# Patient Record
Sex: Female | Born: 1940 | Race: White | Hispanic: No | Marital: Married
Health system: Midwestern US, Community
[De-identification: ages and names within clinical notes are randomized; demographics above are authoritative.]

## PROBLEM LIST (undated history)

## (undated) DIAGNOSIS — 1 ERRONEOUS ENCOUNTER ICD10: Secondary | ICD-10-CM

---

## 2013-09-07 LAB — C. DIFFICILE TOXIN MOLECULAR

## 2013-09-09 LAB — CULTURE, STOOL

## 2013-09-09 LAB — OVA AND PARASITE EXAMINATION

## 2013-09-09 LAB — GIARDIA ANTIGEN

## 2013-10-19 NOTE — Progress Notes (Signed)
Abdominal Pain  Associated symptoms include constipation, diarrhea and nausea. Pertinent negatives include no arthralgias, dysuria, fever, headaches, hematuria, myalgias or vomiting.     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   HENT: Negative for congestion, facial swelling, hearing loss, nosebleeds, trouble swallowing and voice change.    Eyes: Negative for pain and visual disturbance.   Respiratory: Negative for apnea, cough, choking, chest tightness and shortness of breath.    Cardiovascular: Negative for chest pain and leg swelling.   Gastrointestinal: Positive for nausea, abdominal pain, diarrhea and constipation. Negative for vomiting, blood in stool, abdominal distention, anal bleeding and rectal pain.   Endocrine: Negative for cold intolerance and heat intolerance.   Genitourinary: Negative for dysuria, hematuria and flank pain.   Musculoskeletal: Negative for myalgias, back pain, arthralgias, neck pain and neck stiffness.   Skin: Negative for color change, rash and wound.   Allergic/Immunologic: Negative for immunocompromised state.   Neurological: Negative for dizziness, tremors, speech difficulty, weakness, numbness and headaches.   Hematological: Negative for adenopathy. Does not bruise/bleed easily.   Psychiatric/Behavioral: Negative for confusion. The patient is not nervous/anxious.        Past Medical History and Surgical History were reviewed.     Family History and Social History were reviewed.     Physical Exam   Constitutional: She is oriented to person, place, and time. She appears well-developed.   HENT:   Head: Normocephalic.   Eyes: EOM are normal. Pupils are equal, round, and reactive to light. No scleral  icterus.   Neck: Neck supple. No tracheal deviation present. No thyromegaly present.   Cardiovascular: Normal rate and regular rhythm.    Pulmonary/Chest: Breath sounds normal. No respiratory distress. She has no rales.   Abdominal: She exhibits no distension and no mass. There is no tenderness. There is no rebound and no guarding.   Musculoskeletal: She exhibits no edema or tenderness.   Lymphadenopathy:     She has no cervical adenopathy.   Neurological: She is alert and oriented to person, place, and time. No cranial nerve deficit. Coordination normal.   Skin: No rash noted.   Psychiatric: Her behavior is normal.       Lab, Pathology, and Radiology results were reviewed and discussed with the patient.     ASSESSMENT:    ICD-9-CM   1. Cholecystitis, chronic 575.11          PLAN:surgery  Will get clearance related to factor v    The patient was counseled.   Risks, Benefits, and options were extensively explained and accepted. (Extending to over 51% of evaluation)

## 2013-12-08 NOTE — Telephone Encounter (Signed)
S: Patient taking Azithromycin for gum infection and wanting to know if she can still have surgery on Monday at 3:30pm.   R: Patient to call office Monday morning during office hours for decision regarding surgery. To call us back as needed.

## 2013-12-10 LAB — COMPREHENSIVE METABOLIC PANEL
ALT: 27 U/L (ref 13–61)
AST: 24 U/L (ref 0–31)
Albumin,Serum: 4.2 G/dL (ref 3.4–5.0)
Albumin/Globulin Ratio: 1.4 RATIO (ref 1.0–2.5)
Alkaline Phosphatase: 75 U/L (ref 45–117)
Anion Gap: 8 mmol/L (ref 6–16)
BUN/Creatinine Ratio: 19.5 RATIO (ref 6.0–20.0)
BUN: 15 mG/dL (ref 7–25)
CO2: 24 mmol/L (ref 21–31)
Calcium: 9.4 mG/dL (ref 8.2–10.5)
Chloride: 109 mmol/L (ref 98–109)
Creatinine: 0.77 mG/dL (ref 0.60–1.50)
GFR African American: >60 mL/min/{1.73_m2}
GFR Non-African American: >60 mL/min/{1.73_m2}
Globulin: 3 G/dL (ref 2.4–4.1)
Glucose: 81 mG/dL (ref 70–100)
Potassium: 3.9 mmol/L (ref 3.5–5.0)
Sodium: 141 mmol/L (ref 135–145)
Total Bilirubin: 0.8 mG/dL (ref 0.3–1.0)
Total Protein: 7.2 G/dL (ref 6.4–8.3)

## 2013-12-10 LAB — CBC
Hematocrit: 41.4 % (ref 35–47)
Hemoglobin: 13.2 g/dl (ref 11.7–16.0)
MCH: 28.3 pg (ref 26–34)
MCHC: 31.9 g/dl — ABNORMAL LOW (ref 32–36)
MCV: 88.7 fL (ref 79–98)
MPV: 8.8 fL (ref 7.4–10.4)
Platelets: 220 10*3/uL (ref 140–440)
RBC: 4.66 mil/uL (ref 3.8–5.2)
RDW: 14.6 % — ABNORMAL HIGH (ref 11.5–14.5)
WBC: 8.2 10*3/uL (ref 3.6–10.7)

## 2013-12-10 LAB — PROTIME/INR & PTT
INR: 1 (ref 0.8–1.2)
Protime: 10.5 SECONDS (ref 9.0–12.0)
aPTT: 23 SECONDS (ref 20.0–30.5)

## 2013-12-12 NOTE — Telephone Encounter (Addendum)
Pt phoned the office with c/o dysuria.  She states it was primarily yesterday and seems to have resolved today.  She was worried this wasn't normal.  Reassured pt if it has resolved it should be nothing to worry about however I would notify Dr. Queen BlossomMayors.  Dr. Queen BlossomMayors notified.    Dr. Queen BlossomMayors was not concerned at this time since pt's sx have resolved.  Pt instructed to stay hydrated and to call back with any further concerns or questions.

## 2013-12-12 NOTE — Op Note (Signed)
Jenny Christensen:             Jenny Christensen, Jenny Christensen           SURGERY DATE:         12/10/2013  MEDICAL RECORD NUMBER:     [id number]            ADMISSION DATE:       12/10/2013  ACCOUNT #:           [id number]           ADMITTING:  DATE OF BIRTH:       11-07-1940             SURGEON:              Rexene Albertsean J   [name], MD  AGE:                 73                     HOSPITAL SERVICE:                                  Electronically Authenticated                              Rexene Albertsean J Verneice Caspers, MD 12/28/2013 01:04 P      SURGEON:  Rexene Albertsean J. Kristi Hyer, M.D.  PRE-OPERATIVE DIAGNOSIS:   Chronic cholecystitis.  POST-OPERATIVE DIAGNOSIS:   Chronic cholecystitis.  OPERATIVE PROCEDURE:  Laparoscopic cholecystectomy.  ASSISTANTS:  See operating room record.  ANESTHESIA:  General  SIGNIFICANT FINDINGS:  The patient is an elderly female who presented  with fairly classic symptoms of  biliary colic.  Extensive workup including endoscopy, ultrasound and  CCK HIDA scan had been done.   The  studies confirmed chronic cholecystitis.  As a result, surgery was  recommended.  At the time of surgery, the gallbladder itself was enlarged and  thickened.  The cystic duct and common  duct were of normal caliber.  Liver functions were normal.  There were  adhesions to the gallbladder  consistent with past inflammation.  No other gross pathology was noted  on limited study of the rest of  the abdomen via the laparoscope.  PROCEDURE:  The patient was prepped and draped in the usual sterile  manner.  A small periumbilical  incision was made and carried down through subcutaneous tissue.  Hemostasis was maintained by cautery.  The linea alba and peritoneum were carefully opened under direct  visualization.  Retention sutures of 0  Vicryl were placed.  The Hasson trocar was inserted under direct  visualization and tied in place. The  abdomen was insufflated with CO2.  The patient was placed in reverse  Trendelenburg position.  The  laparoscope was inserted.  The abdomen was briefly  explored, with the  aforementioned findings noted.  Under direct visualization, a 10 mm subxiphoid trocar and two 5 mm  subcostal trocars were placed.  The  gallbladder was grasped with forceps and delivered into view.  Adhesions to the undersurface of the  gallbladder were carefully taken down with cautery.  Hemostasis was  maintained by cautery.  Care was  taken to avoid any injury to adjacent structures.  The gallbladder was  decompressed via aspiration  technique.  The triangle of Calot was then carefully dissected. Care  was taken to avoid any injury  to  the structures of the porta hepatis.  The cystic duct was  skeletonized, doubly endoclipped proximally,  singly endoclipped distally, and divided.  The cystic artery was  similarly skeletonized, doubly  endoclipped proximally, singly endoclipped distally, and divided. The  gallbladder was then removed from  its bed in the liver with cautery  dissection.  Further hemostasis was maintained by clips.  The  gallbladder was then placed in an  Endo-Retriever bag and removed through the subxiphoid port. The upper  abdomen was then copiously  irrigated with saline and found to be free of any bleeding or bile  leakage.  All trocars were then  removed.  The abdomen was deflated.  The linea alba at the subxiphoid  and umbilical port sites was  closed with 0 Vicryl.  All skin incisions were closed with  subcuticular 4-0 Vicryl.  The patient  tolerated the procedure well.  cc:   Rexene Albertsean J. Laruen Risser, M.D.          Rexene Albertsean J Joseth Weigel, MD    DOD:12/10/2013 06:44 P  DJM/jes  DOT:12/12/2013 01:13 P  Job Number:  Document Number: [id number]  cc:   Rexene Albertsean J Camisha Srey, MD        [address]        [place] [id number]

## 2013-12-13 NOTE — Telephone Encounter (Signed)
Pt phoned the office seeking mild incisional skin pain advice PO gallbladder surgery.  Suggested taking pain rx, alternating with ibuprofen for the next couple days.  She will be stopping using an ice pack as it is causing her additional pain.  No further questions or concerns at this time. Pt told to call back if suggestion not helping or sx worsen.

## 2013-12-14 ENCOUNTER — Encounter

## 2013-12-14 NOTE — Telephone Encounter (Addendum)
Spoke with pt and we had a previous phone call re: this same issue at the beginning of the week. She states she was having urinary retention last night and this morning again, but was able to urinate. Pt states she used to see a urologist, but the urologist has retired.  Suggested she phone urology and see another physician instead.  Pt states she would in fact do so.  She asked me to [facility] since she was concerned she had a UTI.  Pt states urine is clear, and she is not having any others urinary sx such as pain or weakness.  Dr. Queen BlossomMayors notified.    Dr. Queen BlossomMayors would like to order a U/A C & S for pt. Then if + Bactrim DS #20 BID q 12h for 10 d. Also pt should follow up with PCP for future urology problems.  Pt notified.  Pt declined getting the U/A C & S test at this time and states she will call back if she changes her mind. Dr. Queen BlossomMayors notified.

## 2014-01-04 ENCOUNTER — Ambulatory Visit: Admit: 2014-01-04 | Discharge: 2014-01-04 | Payer: MEDICARE | Attending: Specialist

## 2014-01-04 DIAGNOSIS — Z09 Encounter for follow-up examination after completed treatment for conditions other than malignant neoplasm: Secondary | ICD-10-CM

## 2014-01-04 NOTE — Progress Notes (Signed)
Patient is seen in follow-up after surgery. Patient is healing well with no complaints,     PHYSICAL EXAM:  See above    ASSESSMENT:   Post op state,    PLAN:    Return if symptoms worsen or fail to improve.

## 2014-01-17 NOTE — Telephone Encounter (Signed)
Patient PO cholecystectomy 10/15, phoned the office with c/o diarrhea.  She admits she has not taken the imodium as Dr. Queen BlossomMayors suggested.  Patient requesting reassurance from Dr. Queen BlossomMayors and states she will try her imodium tonight. Dr. Queen BlossomMayors notified.

## 2014-01-18 NOTE — Telephone Encounter (Signed)
Dr. Queen BlossomMayors would like patient to start citrucel or metamucil OTC.  If these make patient worse then she should stop.  Patient notified. Patient declined Lomitril Tabs offered by Dr. Queen BlossomMayors at this time but will call back if she changes her mind.

## 2014-06-26 ENCOUNTER — Ambulatory Visit: Admit: 2014-06-26 | Discharge: 2014-06-26 | Payer: MEDICARE | Attending: Dermatology

## 2014-06-26 DIAGNOSIS — L309 Dermatitis, unspecified: Secondary | ICD-10-CM

## 2014-06-26 LAB — COMPREHENSIVE METABOLIC PANEL
ALT: 47 U/L (ref 13–61)
AST: 24 U/L (ref 0–31)
Albumin,Serum: 3.9 G/dL (ref 3.4–5.0)
Albumin/Globulin Ratio: 1.3 RATIO (ref 1.0–2.5)
Alkaline Phosphatase: 57 U/L (ref 45–117)
Anion Gap: 8 mmol/L (ref 6–16)
BUN/Creatinine Ratio: 31.7 RATIO — ABNORMAL HIGH (ref 6.0–20.0)
BUN: 25 mG/dL (ref 7–25)
CO2: 26 mmol/L (ref 21–31)
Calcium: 9.8 mG/dL (ref 8.2–10.5)
Chloride: 108 mmol/L (ref 98–109)
Creatinine: 0.79 mG/dL (ref 0.60–1.50)
GFR African American: >60 mL/min/{1.73_m2}
GFR Non-African American: >60 mL/min/{1.73_m2}
Globulin: 3.1 G/dL (ref 2.4–4.1)
Glucose: 94 mG/dL (ref 70–100)
Potassium: 5.2 mmol/L — ABNORMAL HIGH (ref 3.5–5.0)
Sodium: 142 mmol/L (ref 135–145)
Total Bilirubin: 0.7 mG/dL (ref 0.3–1.0)
Total Protein: 7 G/dL (ref 6.4–8.3)

## 2014-06-26 LAB — TSH: TSH: 0.553 u[IU]/mL (ref 0.358–3.74)

## 2014-06-26 MED ORDER — TRIAMCINOLONE ACETONIDE 0.1 % EX CREA
0.1 % | CUTANEOUS | Status: AC
Start: 2014-06-26 — End: ?

## 2014-06-26 NOTE — Progress Notes (Signed)
DATE OF SERVICE: 06/26/2014  PATIENT NAME:  Jenny Christensen  DOB:  [birth year]  AGE:  74 y.o.  CLINIC NUMBER:  [id number]      Chief Complaint   Patient presents with   ??? Pruritis     NP (JLS)       HISTORY OF PRESENT ILLNESS:  This is a 74 y.o. female here today for itching and burning on the posterior thighs and into the groin area that started about February. No redness, no rash. Pt had switched to Aleve around that time which made bilateral posterior legs itch, she feels this is what started the whole process. Patient has been evaluated for vaginal yeast infection as she has also been having itching in the vaginal area recently.   Pt has tried Diflucan 2 weeks ago, and Ketoconazole cream which has not helped. Apple cider vinegar has helped the itch.   Pt has had a IM Kenalog injection that did not help.   Pt states she does have a hemorrhoid, but keeps the area clean, and has not noticed any drainage.    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   Pt has factor 5.  Hx AKs  No personal or FMH of MM    History of Bilateral knee and right hip replacement  Hx heart murmur  Currently on ASA 325 mg daily  Allergies to Latex    Does take antibiotics routinely prior to dental visits or other procedures (Amoxicillin 500 mg 4 tablets one hour prior to procedure)    Patient has no h/o pacemaker/ defibrillator,  HIV/ Hep C,  Heart valve replacement, MVP no allergies to Lidocaine, Epinephrine, or Adhesive        Past Medical History   Diagnosis Date   ??? Thyroid disease    ??? Osteoarthritis    ??? Actinic keratitis    ??? Factor 5 Leiden mutation, heterozygous (HCC)    ??? PMR (polymyalgia rheumatica) (HCC)      carrier       Past Surgical History   Procedure Laterality Date   ??? Hip surgery Right 09/2010   ??? Knee surgery Bilateral 05/13, 06/14     right knee was in 06/2011, left knee 07/2012       Family History   Problem Relation Age of Onset   ??? Heart Attack Sister    ??? Other Other       No FMH of MM       History   Substance Use Topics   ??? Smoking status: Never Smoker    ??? Smokeless tobacco: Never Used   ??? Alcohol Use: 1.2 oz/week     2 Glasses of wine per week       Current Outpatient Prescriptions   Medication Sig Dispense Refill   ??? aspirin 325 MG tablet Take by mouth     ??? CALCIUM PO Take by mouth     ??? diltiazem (CARDIZEM CD) 240 MG ER capsule once daily.     ??? DHA-EPA-VITAMIN E PO Take by mouth     ??? Multiple Vitamin (MVI, CELEBRATE, CHEWABLE TABLET) Take 1 tablet by mouth     ??? omeprazole (PRILOSEC) 20 MG capsule once daily.     ??? ibuprofen (ADVIL;MOTRIN) 200 MG tablet Take 200 mg by mouth     ??? levothyroxine (SYNTHROID) 88 MCG tablet Take 88 mcg by mouth     ??? predniSONE (DELTASONE) 1  MG tablet 3 mg      ??? pravastatin (PRAVACHOL) 80 MG tablet      ??? alendronate (FOSAMAX) 70 MG tablet        No current facility-administered medications for this visit.       Allergies   Allergen Reactions   ??? Latex      Other reaction(s): Other: See Comments  Burns   ??? Aleve [Naproxen Sodium] Itching         There is no immunization history on file for this patient.    REVIEW OF SYSTEMS:  General/Constitutional   Weight Change  None.  Fatigue  none.  Chills  denies.  Fever  denies.  Night sweats  denies.    HEENT/Neck   Change in vision  none.  Sinus problems  none.  Hoarseness  none.  Painful swallowing  no.  Sore throat  denies.    Respiratory   Chest pain  none.  Cough  no.  Shortness of breath  denies.  Wheezing  no.   Cardiovascular   Chest pain  denies.  Irregular heart beat  no.  Murmur/Valve  none.  Swelling of ankles  denies.  Gastrointestinal   Abdominal pain  denies.  Bloating  no.  Blood in stool  denies.  Constipation  no.  Diarrhea  no.  Heartburn  denies.  Loss of appetite  no.  Ulcer  no.  Vomiting  no.   Hematology   Anemia  no.  Easy bleeding  denies.  Easy bruising  denies.    Genitourinary   Blood in urine  no.  Frequent Nighttime Urination  none .  Pain with urination  no.  Urinary  incontinence  denies.  Musculoskeletal   Arthritis  denies.  Back pain  denies.  Muscle pain  denies.  Muscle weakness no.  Neck pain  no.  Dermatologic   General  no flushing, no color changes.  Hair loss  no.  Nail changes  no.   Skin   Itching  none.  Rash  none.   Neurologic   Dizziness  denies.  Fainting  denies.  Headache  denies.  Migraines  no. Tingling/numbness  denies.    Psychiatric   Anxiety  denies.  Depression  denies.  Mood swings  no.  Nervousness  no.      There were no vitals filed for this visit.    PHYSICAL EXAM:   General Examination  GENERAL APPEARANCE: alert and oriented, well developed and well nourished  PSYCH: appropriate mood and affect       Dermatology  BUTTOCKS: Bilateral distal buttocks pink to light brown well defined, circular, scaly patches (similar distribution/ location on both sides)         ASSESSMENT AND PLAN:  74 y.o. female with :          ICD-10-CM ICD-9-CM    1. Dermatitis L30.9 692.9 triamcinolone (KENALOG) 0.1 % cream   2. Pruritus L29.9 698.9 Comprehensive Metabolic Panel      TSH with Reflex      TSH without Reflex   3. Thyroid dysfunction E07.9 246.9 Comprehensive Metabolic Panel      TSH with Reflex      TSH without Reflex           1. Dermatitis, most likely irritant / allergic contact dermatitis to toilet seat cleaner vs elastic band in the underwear  Start Triamcinolone cream twice daily to the affected area on the buttocks for 2 weeks,  Stop when clear, do not use on the face armpits and groin.  Switch cleaning products for toilet (use natural product without fragrance/ scents/ dyes)  Continue Free and Clear laundry detergent and stop using dryer sheets  Switch underwear to something without the elastic band around the legs.   2. Pruritus  Refer to #1  Labs ordered: CMP, TSH (patient had CBC checked on Monday with her Rheumatologist)   3. Thyroid dysfunction  Patient reports h/o hypothyroidism and that she has not had her thyroid checked recently.  Labs ordered:  CMP, TSH (patient had CBC checked on Monday with her Rheumatologist)       Orders Placed This Encounter   Medications   ??? triamcinolone (KENALOG) 0.1 % cream     Sig: Apply thin layer to the itchy/scaly areas twice daily x 2 weeks, then use PRN for flares.  Do not use on face, armpits, groin.     Dispense:  80 g     Refill:  0       Return in about 2 weeks (around 07/10/2014) for itching F/U (JLS).    Orders Placed This Encounter   Procedures   ??? Comprehensive Metabolic Panel   ??? TSH with Reflex   ??? TSH without Reflex       Yetta BarreSara A Reginna Sermeno      Publishing copy(Electronically Signed)

## 2014-06-26 NOTE — Patient Instructions (Addendum)
Triamcinolone cream twice daily to the affected area on the buttocks for 2 weeks.  Stop when clear, do not use on the face armpits and groin.    Switch cleaning products for toilet (natural product without fragrance/ scents/ dyes)    Stop dryer sheets    Switch underwear to all cotton underwear without the elastic bands at the legs

## 2014-06-27 NOTE — Telephone Encounter (Signed)
Called pt reviewed information listed below. Pt verbalized an understanding.

## 2014-06-27 NOTE — Telephone Encounter (Signed)
-----   Message from Yetta BarreSara A Lohser, MD sent at 06/27/2014  7:24 AM EDT -----  Please inform patient of lab results:  CMP: BUN/ creatinine ratio and K slightly elevated, otherwise WNL  TSH: WNL  Patient to follow up with PCP for further evaluation and workup if itching persists.  Anticipate improvement of the rash with the treatment plan as outlined at appointment.

## 2014-07-09 ENCOUNTER — Inpatient Hospital Stay
Admit: 2014-07-09 | Discharge: 2014-07-11 | Disposition: A | Discharge status: Home / Self Care | Attending: Cardiovascular Disease | Admitting: Cardiovascular Disease

## 2014-07-09 LAB — DIAGNOSTIC CARDIAC CATH LAB PROCEDURE: Left Ventricular Ejection Fraction: 23

## 2014-07-10 LAB — BASIC METABOLIC PANEL
Anion Gap: 10 NA
BUN: 11 mg/dL (ref 7–25)
CO2: 21 mmol/L (ref 21–32)
Calcium: 7.8 mg/dL — ABNORMAL LOW (ref 8.2–10.1)
Chloride: 109 mmol/L (ref 98–109)
Creatinine: 0.73 mg/dL (ref 0.55–1.40)
EGFR IF NonAfrican American: >60 mL/min (ref >60)
Glucose: 131 mg/dL — ABNORMAL HIGH (ref 70–100)
Potassium: 3.8 mmol/L (ref 3.5–5.1)
Sodium: 140 mmol/L (ref 135–145)
eGFR African American: >60 mL/min (ref >60)

## 2014-07-10 LAB — LIPID PANEL
Cholesterol: 199 mg/dL (ref <200)
HDL: 3 NA
HDL: 75 mg/dL — ABNORMAL HIGH (ref 40–59)
LDL Cholesterol: 90 mg/dL (ref <100)
Triglycerides: 172 mg/dL — AB (ref <150)

## 2014-07-10 LAB — CBC
Hematocrit: 34.7 % — ABNORMAL LOW (ref 35.0–47.0)
Hemoglobin: 11.4 g/dL — ABNORMAL LOW (ref 11.7–16.0)
MCH: 28.4 pg (ref 26.0–34.0)
MCHC: 32.8 % (ref 32.0–36.0)
MCV: 86.4 fL (ref 79.0–98.0)
MPV: 8.4 fL (ref 7.4–10.4)
Platelets: 175 10*3/uL (ref 140–440)
RBC: 4.02 10*6/uL (ref 3.80–5.20)
RDW: 15.8 % — ABNORMAL HIGH (ref 11.5–14.5)
WBC: 8.1 10*3/uL (ref 3.6–10.7)

## 2014-07-10 LAB — CK-MB INDEX
CK-MB Index: 10.3 NA — ABNORMAL HIGH (ref 0.0–4.0)
CK-MB: 109.1 ng/mL — ABNORMAL HIGH (ref 0.5–3.6)

## 2014-07-10 LAB — TSH: TSH: 0.734 uU/mL (ref 0.358–3.740)

## 2014-07-10 LAB — CK: Total CK: 835 U/L — ABNORMAL HIGH (ref 26–192)

## 2014-07-10 LAB — CK WITH REFLEX CK-MB: Total CK: 1064 U/L — ABNORMAL HIGH (ref 26–192)

## 2014-07-11 LAB — BASIC METABOLIC PANEL
Anion Gap: 10 NA
BUN: 14 mg/dL (ref 7–25)
CO2: 20 mmol/L — ABNORMAL LOW (ref 21–32)
Calcium: 8.3 mg/dL (ref 8.2–10.1)
Chloride: 109 mmol/L (ref 98–109)
Creatinine: 0.67 mg/dL (ref 0.55–1.40)
EGFR IF NonAfrican American: >60 mL/min (ref >60)
Glucose: 80 mg/dL (ref 70–100)
Potassium: 3.5 mmol/L (ref 3.5–5.1)
Sodium: 139 mmol/L (ref 135–145)
eGFR African American: >60 mL/min (ref >60)

## 2014-07-11 LAB — CBC
Hematocrit: 35 % (ref 35.0–47.0)
Hemoglobin: 11.4 g/dL — ABNORMAL LOW (ref 11.7–16.0)
MCH: 28.2 pg (ref 26.0–34.0)
MCHC: 32.6 % (ref 32.0–36.0)
MCV: 86.4 fL (ref 79.0–98.0)
MPV: 8.9 fL (ref 7.4–10.4)
Platelets: 160 10*3/uL (ref 140–440)
RBC: 4.05 10*6/uL (ref 3.80–5.20)
RDW: 15.6 % — ABNORMAL HIGH (ref 11.5–14.5)
WBC: 6.2 10*3/uL (ref 3.6–10.7)

## 2014-07-11 LAB — MAGNESIUM: Magnesium: 2.1 mg/dL (ref 1.8–2.4)

## 2014-07-11 LAB — ECHOCARDIOGRAM COMPLETE 2D W DOPPLER W COLOR: Left Ventricular Ejection Fraction: 35

## 2014-07-11 LAB — CK WITH REFLEX CK-MB: Total CK: 846 U/L — ABNORMAL HIGH (ref 26–192)

## 2014-07-11 LAB — PHOSPHORUS: Phosphorus: 2.6 mg/dL (ref 2.5–4.9)

## 2014-07-11 LAB — CK-MB INDEX
CK-MB Index: 8.2 NA — ABNORMAL HIGH (ref 0.0–4.0)
CK-MB: 69 ng/mL — ABNORMAL HIGH (ref 0.5–3.6)

## 2014-07-16 ENCOUNTER — Encounter: Attending: Dermatology

## 2014-08-23 NOTE — Other (Unsigned)
Patient Acct Nbr: [id number]   Primary AUTH/CERT:   Primary Insurance Company Name: [employer]  Primary Insurance Plan name: Medicare A  Primary Insurance Group Number:   Primary Insurance Plan Type: [employer]  Primary Insurance Policy Number: [id number]    Secondary AUTH/CERT:   Secondary Insurance Company Name: [employer]  Secondary Insurance Plan name: Medicare B  Secondary Insurance Group Number:   Secondary Insurance Plan Type: [employer] B  Secondary Insurance Policy Number: [id number]    Tertiary AUTH/CERT:   [employer]iary Insurance Company Name: [employer]  Tertiary Insurance Plan name: [employer] AARP Supplement  [employer]iary Insurance Group Number:   [employer]iary Insurance Plan Type: [employer]  Tertiary Insurance Policy Number: [id number]

## 2014-08-26 ENCOUNTER — Inpatient Hospital Stay: Attending: Cardiovascular Disease

## 2014-09-16 NOTE — Telephone Encounter (Signed)
Pt called stating she has questions about medication that she was given at the last visit. Pt states she is still having problems.    Returned pt call. LMOM for pt to call back

## 2014-09-24 ENCOUNTER — Ambulatory Visit: Admit: 2014-09-24 | Discharge: 2014-09-24 | Payer: MEDICARE | Attending: Dermatology

## 2014-09-24 DIAGNOSIS — R21 Rash and other nonspecific skin eruption: Secondary | ICD-10-CM

## 2014-09-24 MED ORDER — ERYTHROMYCIN 2 % EX OINT
2 % | CUTANEOUS | 6 refills | Status: AC
Start: 2014-09-24 — End: ?

## 2014-09-24 NOTE — Progress Notes (Signed)
DATE OF SERVICE: 09/24/2014  PATIENT NAME:  Jenny Christensen  DOB:  12-Sep-1940  AGE:  74 y.o.  CLINIC NUMBER:  [id number]      Chief Complaint   Patient presents with   . Skin Lesion     Last saw Dr.Lohser on 06-26-14-[facility]       HISTORY OF PRESENT ILLNESS:  This is a 74 y.o. female c/o of 2 concerned patches underneath buttocks x 20 years.  Pt Has seen 4 doctors which give her topical creams which did not help. Wipe with vinger water and use either desode or monstat on it. C/o of burning. Pt saw Dr.Lohser and gave her Triamcinolone cream which did not help. Pt. Has been told by another dermatologist that this is due to her "bony butt" and friction of the overlying skin when she sits, but she states that she sits on a pillow and thus does not believe this to be true.    History of Bilateral knee and right hip replacement  Hx heart murmur  Currently on ASA 325 mg daily  Allergies to Latex   Does take antibiotics routinely prior to dental visits or other procedures (Amoxicillin 500 mg 4 tablets one hour prior to procedure)    Past Medical History   Diagnosis Date   . Actinic keratitis    . Factor 5 Leiden mutation, heterozygous (HCC)    . Heart attack (HCC)    . Osteoarthritis    . PMR (polymyalgia rheumatica) (HCC)      carrier   . Thyroid disease        Past Surgical History   Procedure Laterality Date   . Hip surgery Right 09/2010   . Knee surgery Bilateral 05/13, 06/14     right knee was in 06/2011, left knee 07/2012       Family History   Problem Relation Age of Onset   . Heart Attack Sister    . Other Other      No FMH of MM       Social History   Substance Use Topics   . Smoking status: Never Smoker   . Smokeless tobacco: Never Used   . Alcohol use 1.2 oz/week     2 Glasses of wine per week     Social History:     Current Outpatient Prescriptions   Medication Sig Dispense Refill   . Erythromycin 2 % OINT Apply to affected areas bid 25 g 6   . aspirin 325 MG tablet Take by mouth     . lisinopril (PRINIVIL;ZESTRIL) 2.5 MG  tablet      . levothyroxine (SYNTHROID) 75 MCG tablet      . gabapentin (NEURONTIN) 300 MG capsule      . carvedilol (COREG) 3.125 MG tablet      . calcitRIOL (ROCALTROL) 0.5 MCG capsule      . LORazepam (ATIVAN) 1 MG tablet      . NITROSTAT 0.4 MG SL tablet      . pantoprazole (PROTONIX) 40 MG tablet      . traMADol (ULTRAM) 50 MG tablet      . BRILINTA 90 MG TABS tablet      . CALCIUM PO Take by mouth     . diltiazem (CARDIZEM CD) 240 MG ER capsule once daily.     . DHA-EPA-VITAMIN E PO Take by mouth     . Multiple Vitamin (MVI, CELEBRATE, CHEWABLE TABLET) Take 1 tablet by mouth     . omeprazole (  PRILOSEC) 20 MG capsule once daily.     Marland Kitchen triamcinolone (KENALOG) 0.1 % cream Apply thin layer to the itchy/scaly areas twice daily x 2 weeks, then use PRN for flares.  Do not use on face, armpits, groin. 80 g 0   . pravastatin (PRAVACHOL) 80 MG tablet      . alendronate (FOSAMAX) 70 MG tablet        No current facility-administered medications for this visit.        Allergies   Allergen Reactions   . Latex      Other reaction(s): Other: See Comments  Burns   . Aleve [Naproxen Sodium] Itching         REVIEW OF SYSTEMS:   General/ Constitutional   Feels well; no h/o fatigue, weight change, night sweats, fevers or chills     Dermatologic  As per HPI; otherwise negative; no new rashes, itching, hair loss, skin or nail changes       There were no vitals filed for this visit.    PHYSICAL EXAM:     GENERAL APPEARANCE: Alert, oriented, pleasant.  PSYCH: appropriate mood and affect     DERM: the bilateral lower buttocks show symmetrical slightly hyperpigmented scaly, ill-defined patches.    PROCEDURES:  Punch Biopsy:   Indication: uncertain nature of rash/to obtain a skin specimen for histopathologic evaluation  Consent: All risks, benefits, and potential complications were discussed including bleeding, infection, and scarring. It was explained that this procedure was for diagnostic purposes and was not being performed with the  intention of a curing their condition.  Location 1: Left lower buttock, R/O Persistent bilateral rash due to friction/pressure. (Pt. Does not agree with diagnosis and requests biopsy).   Method: Site cleansed with alcohol, injected with 1% lido with epinephrine and then the punch instrument ( 4 mm), forceps, and pointed scissors were used to procure a representative specimen. The defect was then closed using: 5-0 prolene. Number of sutures:1.   Post-OP: Pressure bandage was applied and is to remain in place for 24 hours. Thereafter, the patient is instructed to cleanse the wound once daily with soap and water, apply Vaseline and a bandage for one week. The patient is instructed to notify the office if the wound site ooze, become painful or red.  The biopsy specimen was sent to the laboratory for pathological evaluation. We will call the patient in seven to fourteen days with the biopsy results    ASSESSMENT AND PLAN:      1. Rash  Surgical Pathology    PR BIOPSY OF SKIN LESION   I agree with what she has been told by other providers that this long-standing skin eruption is due to friction/pressure from how she sits. However, the patient is quite distressed by this and bothered by the burning of her skin at the sites and she really wanted me to do a biopsy, so I agreed. Will consider EpiCeram if the biopsy confirms my suspicions of the cause of this.     Return for Next Wednesday-Suture removal, 4-6 weeks-Rash.

## 2014-10-01 LAB — SURGICAL PATHOLOGY

## 2014-10-01 NOTE — Telephone Encounter (Signed)
Patient called and left a message stating that her suture has fallen out of the buttock area and was wondering if she should still come in for her suture appointment tomorrow.  Please advise

## 2014-10-02 ENCOUNTER — Encounter: Admit: 2014-10-02 | Discharge: 2014-10-02 | Payer: MEDICARE

## 2014-10-02 DIAGNOSIS — Z4802 Encounter for removal of sutures: Secondary | ICD-10-CM

## 2014-10-02 NOTE — Telephone Encounter (Signed)
Pt came in today for suture removal and Dr.Lohser checked where the stitch was and it was gone.

## 2014-10-02 NOTE — Telephone Encounter (Signed)
The only way to be sure the suture has fallen out is if she had the actual suture in her hand.  If she did not see the actual suture itself after it came out, she needs to come back in for [place] to be sure it is out.

## 2014-10-03 NOTE — Telephone Encounter (Signed)
Informed patient of below per Dr. Loraine Leriche.  Patient stated that she would like to try the Excipial cream at this time.  Patient educated and verbalized understanding.

## 2014-10-03 NOTE — Telephone Encounter (Signed)
LMTCO.

## 2014-10-03 NOTE — Telephone Encounter (Signed)
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  It absolutely is not acanthosis nigricans (or the other diagnoses she showed me on the paper that she wrote out but I can't remember what they are).  It is most likely from friction, even though she sits on a pillow.  Please either call in a rx for LacHydrin 12% cream, apply to affected areas bid, 1 largest trade size, 11 refills to whatever pharmacy she would like it sent to.  She can apply this twice a day for as long as it takes to see improvement/until follow up.  Or, she can purchase Excipial Cream over the counter.  She can follow up with me in about 3 months if she would like.

## 2014-10-03 NOTE — Progress Notes (Signed)
Suture Removal:   Procedure sutures were d/c'd with forceps and suture cutting scissors. Sutures removed number of sutures #0-suture fell out, no Signs/symptoms of infection, no discharge/drainage, wound care reviewed, pt tolerated procedure well.  Dr. Diego CoryLohser looked at patient's biopsy site of the left lower buttock to verify that patient's 1 suture had fallen out prior to suture removal.

## 2014-10-16 MED ORDER — AMMONIUM LACTATE 12 % EX LOTN
12 % | CUTANEOUS | 11 refills | Status: AC
Start: 2014-10-16 — End: ?

## 2014-10-16 NOTE — Telephone Encounter (Signed)
Patient called and left a message requesting the Lac-Hydrin moisturizer to be called into her pharmacy.  Ok per Dr. Loraine Leriche Rx was sent and patient was notified.

## 2014-11-06 ENCOUNTER — Ambulatory Visit: Admit: 2014-11-06 | Discharge: 2014-11-06 | Payer: MEDICARE | Attending: Dermatology

## 2014-11-06 DIAGNOSIS — M792 Neuralgia and neuritis, unspecified: Secondary | ICD-10-CM

## 2014-11-06 NOTE — Progress Notes (Signed)
DATE OF SERVICE: 11/06/2014  PATIENT NAME:  Jenny Christensen  DOB:  06/29/1940  AGE:  74 y.o.  CLINIC NUMBER:  [id number]      Chief Complaint   Patient presents with   . Rash     F/u, DMR       HISTORY OF PRESENT ILLNESS:  This is a 74 y.o. female F/u dry skin on the buttocks, unchanged from last office visit, patient used Lac-Hydrin lotion with no help and caused burning, patient stated that she is still getting a burning sensation and is using Diluted vinegar with help.  The patient gets a terrible burning pain that radiates down the back of her thighs. It is nearly constant.  She has a "bad back" and "a lot of arthritis". She declined back surgery which was recommended to her in the past due to risks. She was given gabapentin for her back pain; at first she took it bid but could not tolerate it at that dose, so she takes it once daily.  She does not think it helps.     Past Medical History   Diagnosis Date   . Actinic keratitis    . Factor 5 Leiden mutation, heterozygous (HCC)    . Heart attack (HCC)    . Osteoarthritis    . PMR (polymyalgia rheumatica) (HCC)      carrier   . Thyroid disease        Past Surgical History   Procedure Laterality Date   . Hip surgery Right 09/2010   . Knee surgery Bilateral 05/13, 06/14     right knee was in 06/2011, left knee 07/2012       Family History   Problem Relation Age of Onset   . Heart Attack Sister    . Other Other      No FMH of MM       Social History   Substance Use Topics   . Smoking status: Never Smoker   . Smokeless tobacco: Never Used   . Alcohol use 1.2 oz/week     2 Glasses of wine per week     Social History:     Current Outpatient Prescriptions   Medication Sig Dispense Refill   . ammonium lactate (LAC-HYDRIN) 12 % lotion Apply topically daily. 222 mL 11   . lisinopril (PRINIVIL;ZESTRIL) 2.5 MG tablet      . levothyroxine (SYNTHROID) 75 MCG tablet      . gabapentin (NEURONTIN) 300 MG capsule      . carvedilol (COREG) 3.125 MG tablet      . calcitRIOL (ROCALTROL) 0.5  MCG capsule      . LORazepam (ATIVAN) 1 MG tablet      . NITROSTAT 0.4 MG SL tablet      . pantoprazole (PROTONIX) 40 MG tablet      . traMADol (ULTRAM) 50 MG tablet      . BRILINTA 90 MG TABS tablet      . Erythromycin 2 % OINT Apply to affected areas bid 25 g 6   . aspirin 325 MG tablet Take by mouth     . CALCIUM PO Take by mouth     . diltiazem (CARDIZEM CD) 240 MG ER capsule once daily.     . DHA-EPA-VITAMIN E PO Take by mouth     . Multiple Vitamin (MVI, CELEBRATE, CHEWABLE TABLET) Take 1 tablet by mouth     . omeprazole (PRILOSEC) 20 MG capsule once daily.     Marland Kitchen triamcinolone (  KENALOG) 0.1 % cream Apply thin layer to the itchy/scaly areas twice daily x 2 weeks, then use PRN for flares.  Do not use on face, armpits, groin. 80 g 0   . pravastatin (PRAVACHOL) 80 MG tablet      . alendronate (FOSAMAX) 70 MG tablet        No current facility-administered medications for this visit.        Allergies   Allergen Reactions   . Latex      Other reaction(s): Other: See Comments  Burns   . Aleve [Naproxen Sodium] Itching         REVIEW OF SYSTEMS:   General/ Constitutional   Feels well; no h/o fatigue, weight change, night sweats, fevers or chills     Dermatologic  As per HPI; otherwise negative; no new rashes, itching, hair loss, skin or nail changes       There were no vitals filed for this visit.    PHYSICAL EXAM:     GENERAL APPEARANCE: Alert, oriented, pleasant.  PSYCH: appropriate mood and affect     DERM: dry patches of the skin of the buttocks; otherwise, skin of the buttocks, thighs appear normal.     ASSESSMENT AND PLAN:      1. Nerve pain     there are normal skin findings/her symptoms are way out of proportion to the physical findings.  I believe she is having pain from spinal nerve impingement. She was advised to discuss this with her pain management physician. And, if that does not help, she was advised to see a neurologist.     Return if symptoms worsen or fail to improve.

## 2016-08-16 ENCOUNTER — Ambulatory Visit: Admit: 2016-08-16 | Discharge: 2016-08-16 | Payer: MEDICARE

## 2016-08-16 DIAGNOSIS — L570 Actinic keratosis: Secondary | ICD-10-CM

## 2016-08-16 NOTE — Progress Notes (Signed)
DATE OF SERVICE: 08/16/2016  PATIENT NAME:  Jenny Christensen  DOB:  07/18/1940  AGE:  76 y.o.  CLINIC NUMBER:  [id number]    Chief Complaint   Patient presents with   . Other     F/U LOV 11/06/2014 (TM)       HISTORY OF PRESENT ILLNESS:  This is a 76 y.o. female here today multiple spots of concern on the face, ongoing for a couple of months. Pt reports the spots are itchy and flaky. Denies burning, but states lesion on her nose does bleed. Denies previous treatment for the lesions. She was last seen on 11/06/2014. Spends 5 months in [place] during the winter and admits to a great deal of sun exposure. States she wears sunscreen and makeup with spf.    C/o spot on the right side of face,  ongoing for 2-3 months. Patient reports the spot is itchy and flakes off.     C/o spot on the nose, ongoing for 2-3 months. Patient reports the spot is itchy , flakes off ,and bleeds.      Patient has no h/o of ATN,   Patient has h/o AKs,   Patient has no personal h/o skin cancer   Patient has no family h/o melanoma.    Are you pregnant or breastfeeding? no, History of pacemaker/ defibrillator? no, History of prostheses or joint replacement? Yes R Hip, Bilateral Knees, History of HIV/ Hep C? no, History of Heart valve replacement, MVP or heart murmur? no, Currently on any blood thinners? yes, Allergies to Lidocaine, Epinephrine, Latex or Adhesive? yes, Do you take antibiotics routinely prior to dental visits or other procedures? yes    Past Medical History:   Diagnosis Date   . Actinic keratitis    . Factor 5 Leiden mutation, heterozygous (HCC)    . Heart attack (HCC)    . Osteoarthritis    . PMR (polymyalgia rheumatica) (HCC)     carrier   . Thyroid disease        Past Surgical History:   Procedure Laterality Date   . HIP SURGERY Right 09/2010   . KNEE SURGERY Bilateral 05/13, 06/14    right knee was in 06/2011, left knee 07/2012       Family History   Problem Relation Age of Onset   . Heart Attack Sister    . Other Other         No FMH of MM        Social History   Substance Use Topics   . Smoking status: Never Smoker   . Smokeless tobacco: Never Used   . Alcohol use 1.2 oz/week     2 Glasses of wine per week       Current Outpatient Prescriptions   Medication Sig Dispense Refill   . ammonium lactate (LAC-HYDRIN) 12 % lotion Apply topically daily. 222 mL 11   . lisinopril (PRINIVIL;ZESTRIL) 2.5 MG tablet      . levothyroxine (SYNTHROID) 75 MCG tablet      . gabapentin (NEURONTIN) 300 MG capsule      . carvedilol (COREG) 3.125 MG tablet      . calcitRIOL (ROCALTROL) 0.5 MCG capsule      . LORazepam (ATIVAN) 1 MG tablet      . NITROSTAT 0.4 MG SL tablet      . pantoprazole (PROTONIX) 40 MG tablet      . traMADol (ULTRAM) 50 MG tablet      . BRILINTA 90 MG TABS  tablet 60 mg      . Erythromycin 2 % OINT Apply to affected areas bid 25 g 6   . aspirin 325 MG tablet Take by mouth     . CALCIUM PO Take by mouth     . diltiazem (CARDIZEM CD) 240 MG ER capsule once daily.     . DHA-EPA-VITAMIN E PO Take by mouth     . Multiple Vitamin (MVI, CELEBRATE, CHEWABLE TABLET) Take 1 tablet by mouth     . omeprazole (PRILOSEC) 20 MG capsule once daily.     Marland Kitchen triamcinolone (KENALOG) 0.1 % cream Apply thin layer to the itchy/scaly areas twice daily x 2 weeks, then use PRN for flares.  Do not use on face, armpits, groin. 80 g 0   . pravastatin (PRAVACHOL) 80 MG tablet      . alendronate (FOSAMAX) 70 MG tablet        No current facility-administered medications for this visit.         Allergies   Allergen Reactions   . Latex      Other reaction(s): Other: See Comments  Burns   . Aleve [Naproxen Sodium] Itching         There is no immunization history on file for this patient.    REVIEW OF SYSTEMS:  General/ Constitutional   Feels well; no h/o fatigue, weight change, night sweats, fevers or chills     Dermatologic  As per HPI; otherwise negative;  no new rashes, itching, hair loss, skin or nail changes     There were no vitals filed for this visit.    PHYSICAL EXAM:   General  Examination  GENERAL APPEARANCE: alert and oriented x  3, well developed and well nourished  PSYCH: appropriate mood and affect       Dermatology  FACE: R Temple pink scaly macules x 3, L Temple pink scaly macules x 2  NOSE: Tip of nose pink scaly macules x 1   GENERAL: R Flank 0.5 x 0.3 waxy pink papule   (all measurements are in centimeters, unless otherwise noted)    PROCEDURES:   Cryotherapy Actinic Keratosis:   Reason for treatment: It was explained that actinic keratosis are precancerous.   Consent: The patient understood all the risks and benefits prior to treatment. The risks explained included scarring, hyper and/or hypopigmentation. Although this treatment is highly effective, recurrences do occur and this was explained to the patient. The patient further understood that these lesions are precancerous and may develop into a malignancy.   Number of lesions treated/ location: Total = 6, R Temple x 3, L Temple x 2,Tip of nose x 1   Method: Liquid nitrogen was used to treat the lesion(s) with two freeze-thaw cycles.  Post-op: The patient was instructed to clean the site normally twice a day. Signs of infection were reviewed and patient was instructed to call if he/ she develops increasing pain, purulent drainage, or beefy redness. The patient was informed that a blister may occur at the cryo site and that this is an expected event.  Wynelle Link protection was reviewed and patient advised to use sunscreen with SPF 30 or greater on exposed skin when outdoors.  Post-op instructions were given orally and in writing.       ASSESSMENT AND PLAN:  76 y.o. female with:    ICD-10-CM ICD-9-CM    1. Actinic keratoses L57.0 702.0 PR DESTRUC PREMALIGNANT, FIRST LESION      PR DESTRUC PREMALIGNANT,2-14 LESIONS  2. Seborrheic keratoses L82.1 702.19        1. Actinic keratoses   LN2 for AK today. Patient educated on actinic keratosis and the possibility of transformation into SCC. Treatment options are discussed with risks and  benefits reviewed. Cryotherapy is agreed upon and performed. Reassured that redness, swelling and the formation of a blister at the site of cryotherapy are possible reactions. After care instructions are given and reviewed. Patient to apply Vaseline to treated sites while healing.  Patient to monitor for resolution. If unresolved after 2-3 months, patient to return for further evaluation and management     2. Seborrheic keratoses Reassured and educated, benign finding. Given educational pamphlet about seborrheic keratoses.       No orders of the defined types were placed in this encounter.    Return in about 3 months (around 11/16/2016) for AK.    Orders Placed This Encounter   Procedures   . PR DESTRUC PREMALIGNANT, FIRST LESION   . PR DESTRUC PREMALIGNANT,2-14 LESIONS           Kebin Maye DAVIS Environmental consultantWALKER    (Electronically Signed)

## 2017-07-13 NOTE — Telephone Encounter (Signed)
This encounter was created in error - please disregard.

## 2023-05-08 IMAGING — MR MRI LEFT ANKLE WITHOUT CONTRAST
4 of 5 series · 21 of 40 positions shown · IV contrast (gadolinium)
Comparison: None.

________________________________________________________________________________________________ 
MRI LEFT FOOT WITHOUT CONTRAST, MRI LEFT ANKLE WITHOUT CONTRAST, 05/08/2023 [DATE]: 
CLINICAL INDICATION: Sprain Of Unspecified Ligament Of Left Ankle, Initial 
Encounter. Patient twisted her ankle 6 weeks ago.
TECHNIQUE: Multiplanar, multiecho position MR images of the left foot and ankle 
were performed without intravenous gadolinium enhancement.

[Series 301: PD fat-sat · sagittal · right · 2.5mm · 0.37mm/px · 7 of 28 slices shown (1 of 3)]
[im 1/28]
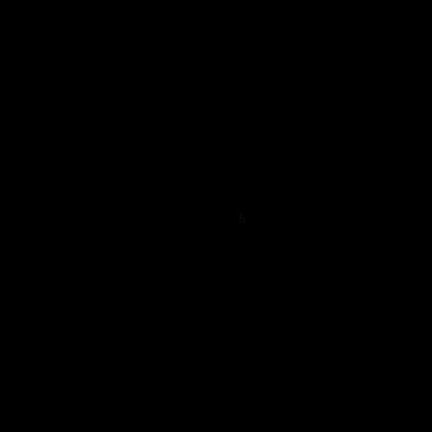
[im 5/28]
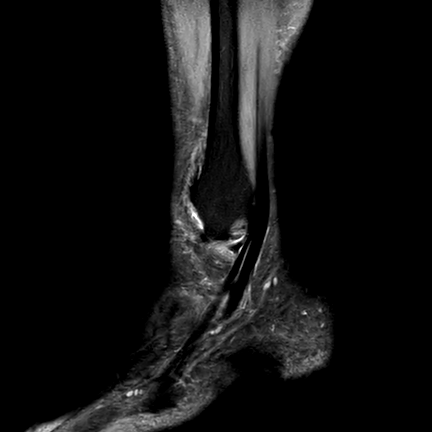
[im 10/28]
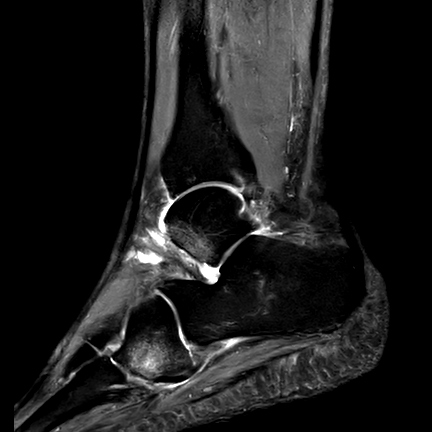
[im 14/28]
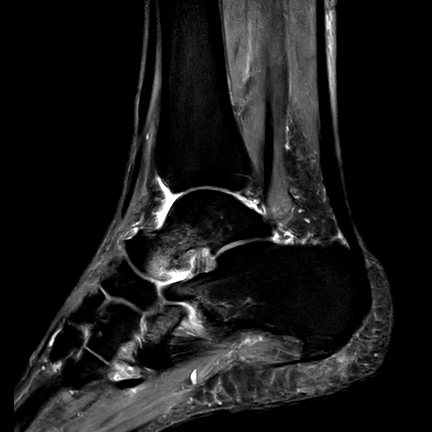
[im 19/28]
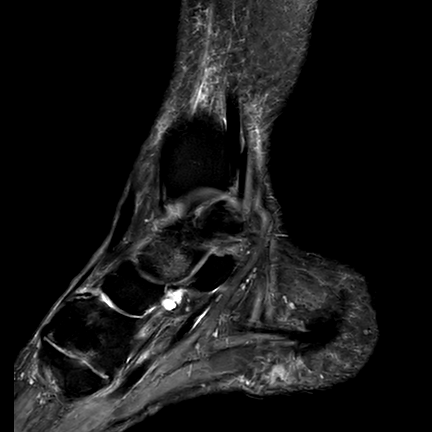
[im 23/28]
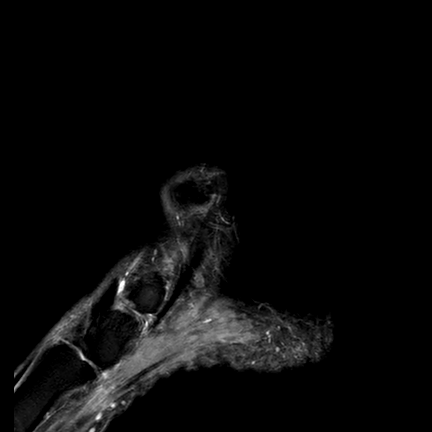
[im 28/28]
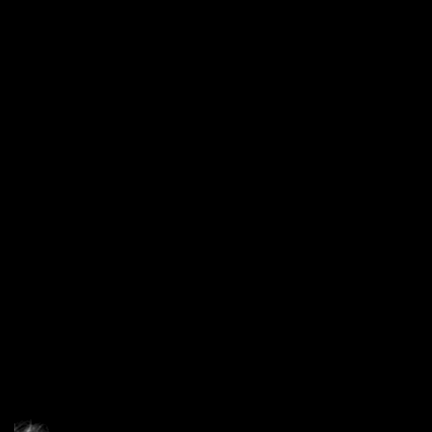

[Series 401: T1 · sagittal · right · 2.5mm · 0.31mm/px · 3 of 28 slices shown]
[im 5/28]
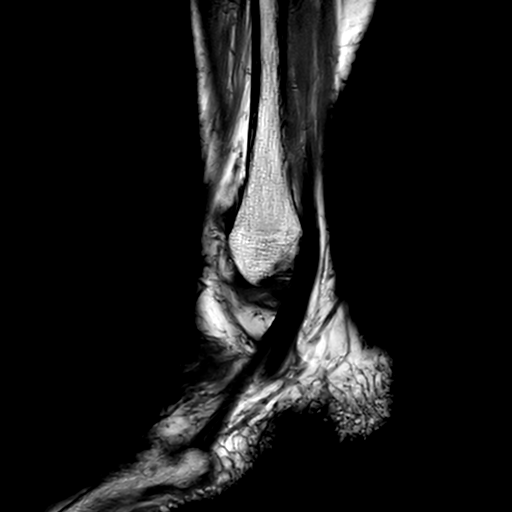
[im 14/28]
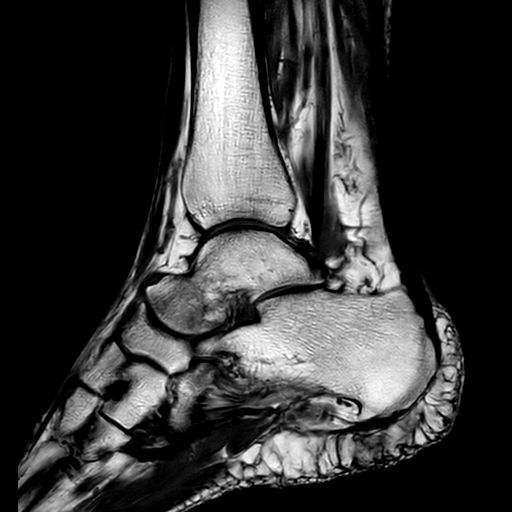
[im 23/28]
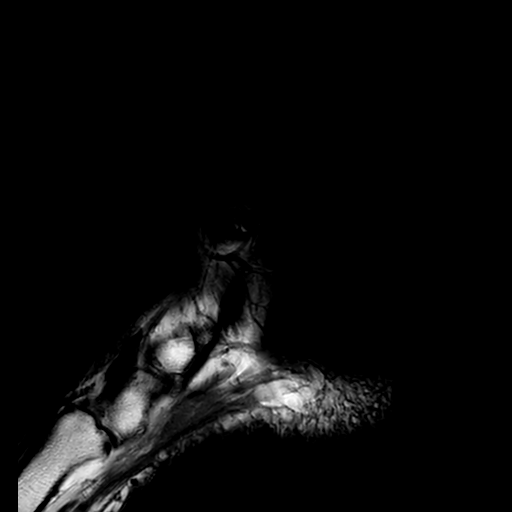

[Series 501: PD fat-sat · axial · right · 3.0mm · 0.31mm/px · z∈[-84,+36]mm · 8 of 42 slices shown (2 of 3)]
[im 1/42]
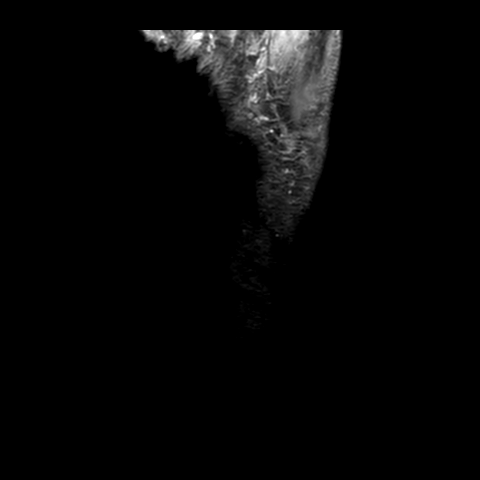
[im 5/42]
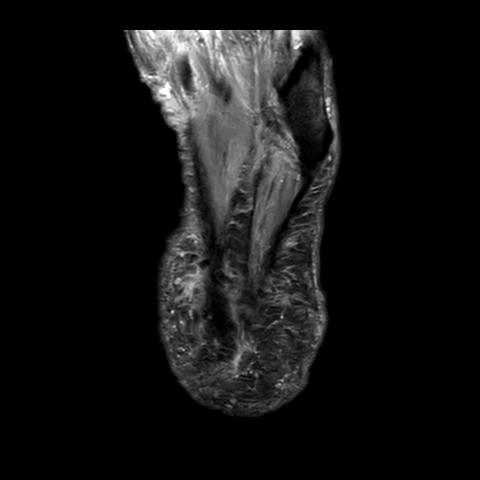
[im 9/42]
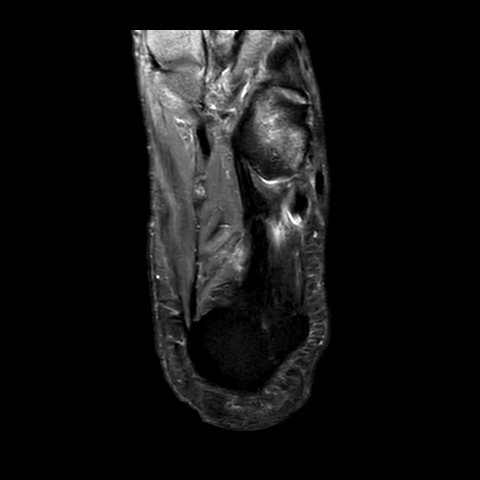
[im 13/42]
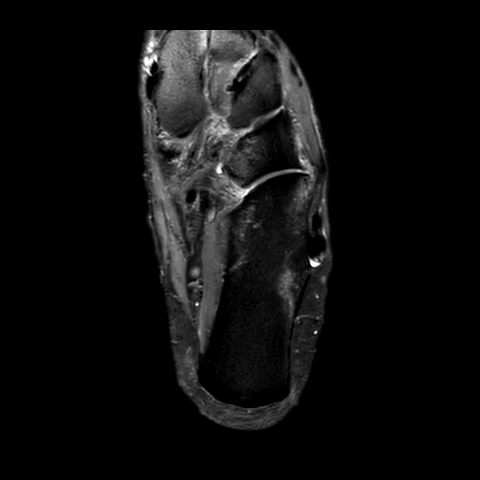
[im 17/42]
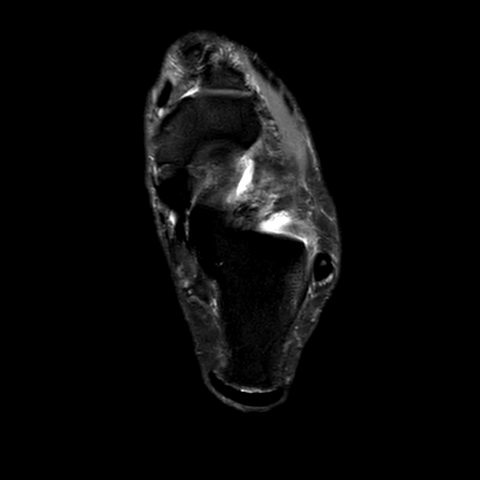
[im 21/42]
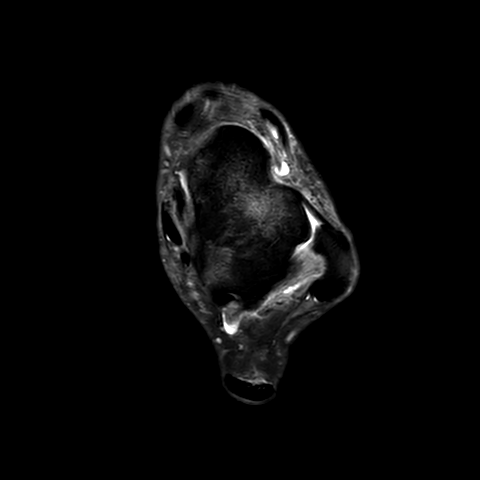
[im 25/42]
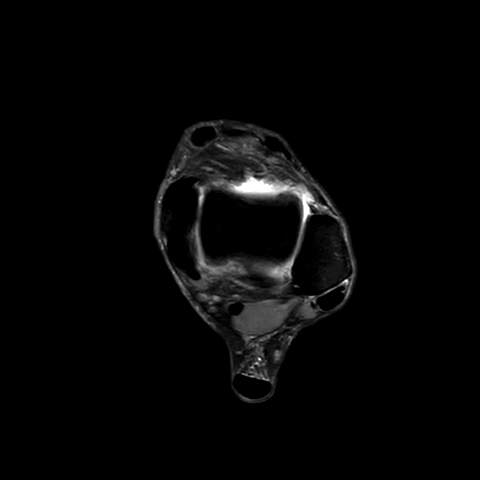
[im 37/42]
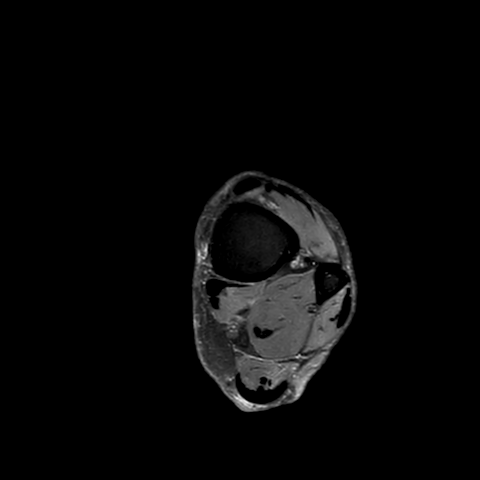

[Series 601: PD fat-sat · coronal · right · 3.0mm · 0.37mm/px · 3 of 36 slices shown (3 of 3)]
[im 5/36]
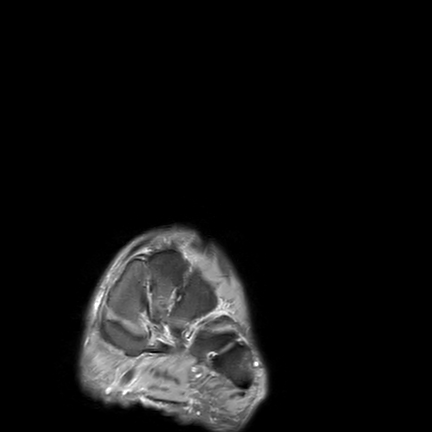
[im 18/36]
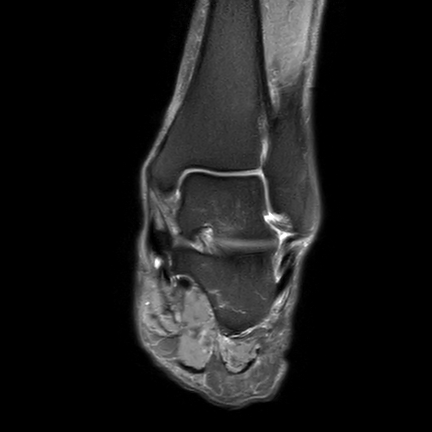
[im 31/36]
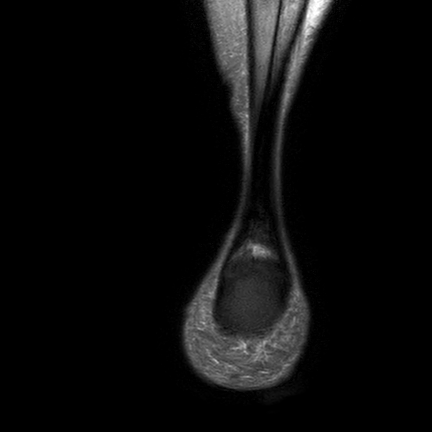

[21 of 40 positions shown; findings below may reference images not displayed]

FINDINGS: TENDONS: Mild posterior tibial tendon (PTT) thickening and minimal tendon sheath 
fluid, consistent with tendinosis. Small amount of peroneal tendon sheath fluid, 
consistent with tenosynovitis. The remaining flexor, extensor and peroneal 
tendons are intact. The Achilles tendon is preserved. 
LATERAL LIGAMENTS: The anterior talofibular ligament is intact. The 
calcaneofibular ligament and posterior talofibular ligament are preserved. 
SYNDESMOTIC LIGAMENTS: The anterior and posterior tibiofibular and interosseous 
ligaments are preserved. 
DELTOID LIGAMENTOUS COMPLEX: The deep and superficial components of the deltoid 
ligamentous complex are intact. 
SINUS TARSI LIGAMENTS: The cervical and interosseous ligaments are preserved. 
The inferior extensor retinaculum appears intact.  
MIDFOOT LIGAMENTS: Midfoot ligaments are intact. Specifically, the Lisfranc 
ligament is intact. 
BONES AND JOINTS: Subacute, incomplete transverse fracture through the lateral 
distal cuboid with extension into the fourth tarsometatarsal (TMT) joint. 0.6 cm 
focus of dorsal talar head flattening with subjacent bone marrow edema, 
consistent with subacute fracture. Contusions of the talar body, anterior 
process of the calcaneus and fifth metatarsal base. 0.3 cm stable osteochondral 
lesion in the anterior medial talar dome and with minimal cystic change and 
overlying cartilage fissuring. Small ankle joint effusion. Partial thickness 
chondromalacia of the subtalar joint. Mild degenerative change of the midfoot. 
Metatarsal-phalangeal (MTP) and interphalangeal (IP) joints are preserved. 
MUSCLES: Musculature is symmetric without mass, signal abnormality or atrophy.  
OTHER SOFT TISSUES: Tarsal tunnel is preserved. Sinus tarsi fat is preserved. 
Plantar fascia is intact. No ulceration. No abscess.
IMPRESSION: 1.  No ligament tear. 
2.  Subacute, incomplete transverse lateral distal cuboid intra-articular 
fracture.  
3.  Subacute 0.6 cm dorsal talar head impaction fracture.  
4.  Contusions of the talar body, anterior process of the calcaneus and 5th 
metatarsal base.  
5.  0.3 cm medial talar dome stable osteochondral lesion and small ankle joint 
effusion.  
6.  Mild degenerative change of the subtalar joint/midfoot.  
7.  Mild PTT tendinosis and peroneal tenosynovitis.

## 2023-05-08 IMAGING — MR MRI LEFT FOOT WITHOUT CONTRAST
4 of 6 series · 17 of 40 positions shown · IV contrast (gadolinium)
Comparison: None.

________________________________________________________________________________________________ 
MRI LEFT FOOT WITHOUT CONTRAST, MRI LEFT ANKLE WITHOUT CONTRAST, 05/08/2023 [DATE]: 
CLINICAL INDICATION: Sprain Of Unspecified Ligament Of Left Ankle, Initial 
Encounter. Patient twisted her ankle 6 weeks ago.
TECHNIQUE: Multiplanar, multiecho position MR images of the left foot and ankle 
were performed without intravenous gadolinium enhancement.

[Series 801: survey_mst · oblique · 10.0mm · 0.78mm/px · 2 of 15 slices shown]
[im 1/15]
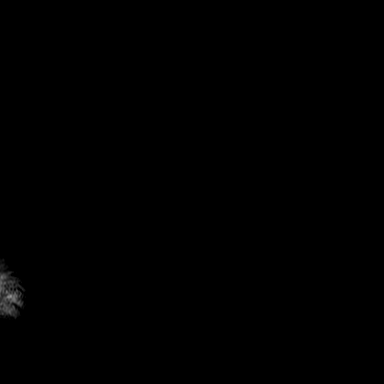
[im 15/15]
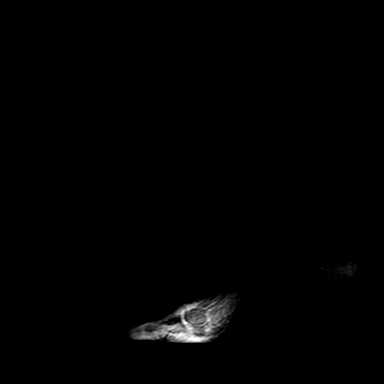

[Series 901: t1w_foot cs hr · sagittal · 2.5mm · 0.31mm/px · 3 of 32 slices shown]
[im 7/32]
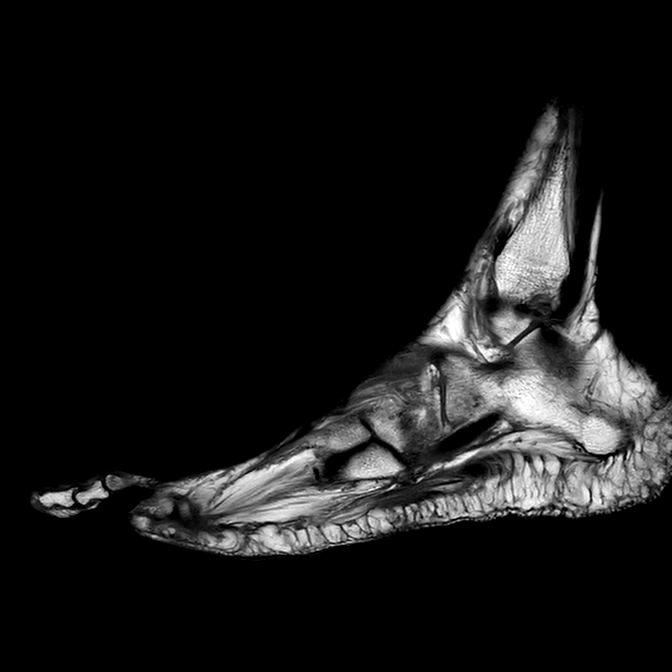
[im 19/32]
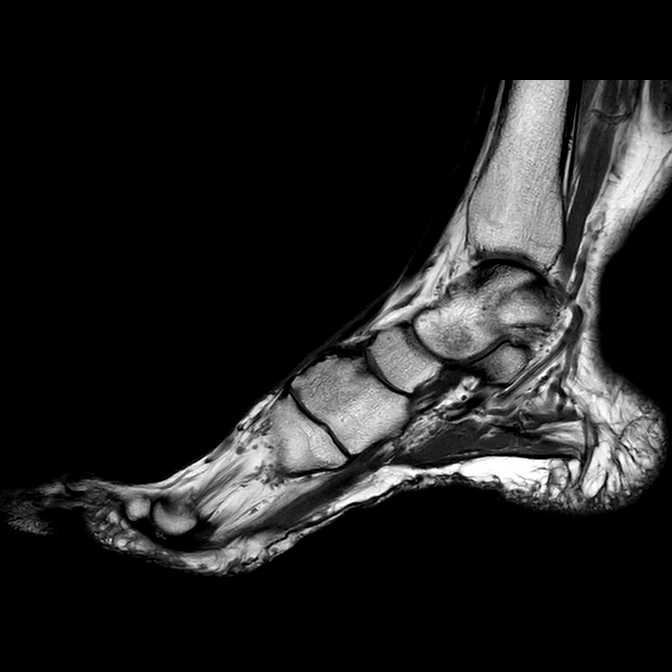
[im 32/32]
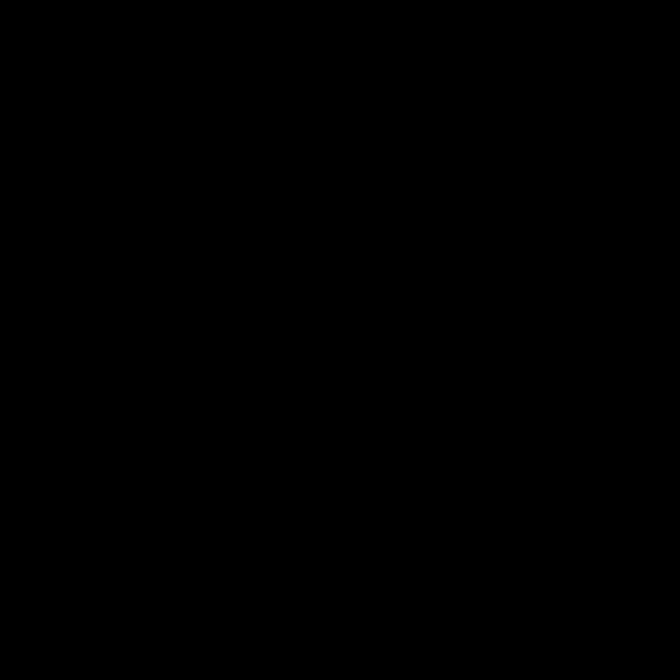

[Series 1001: PD fat-sat · sagittal · 2.5mm · 0.33mm/px · 6 of 31 slices shown]
[im 1/31]
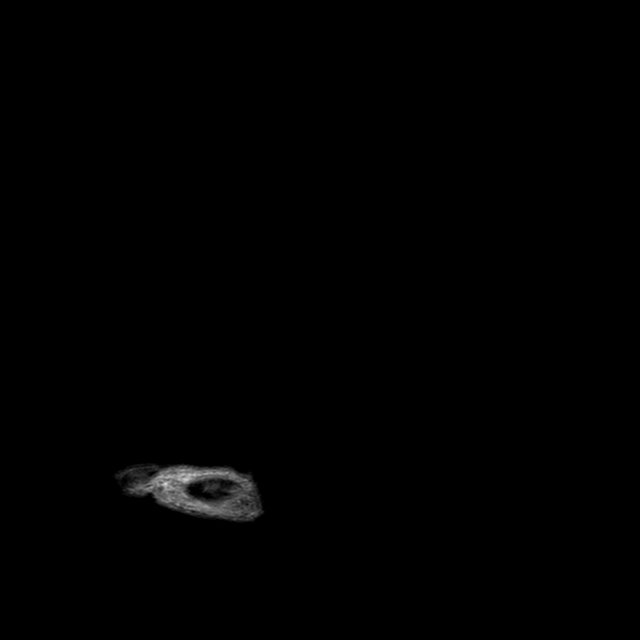
[im 7/31]
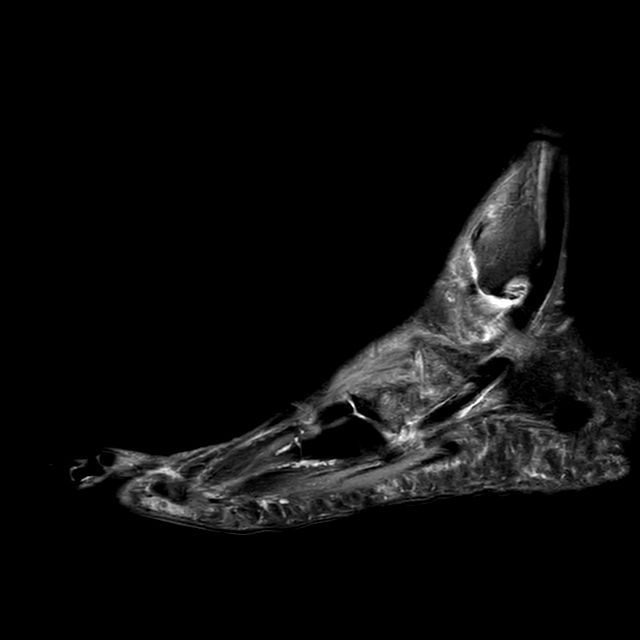
[im 13/31]
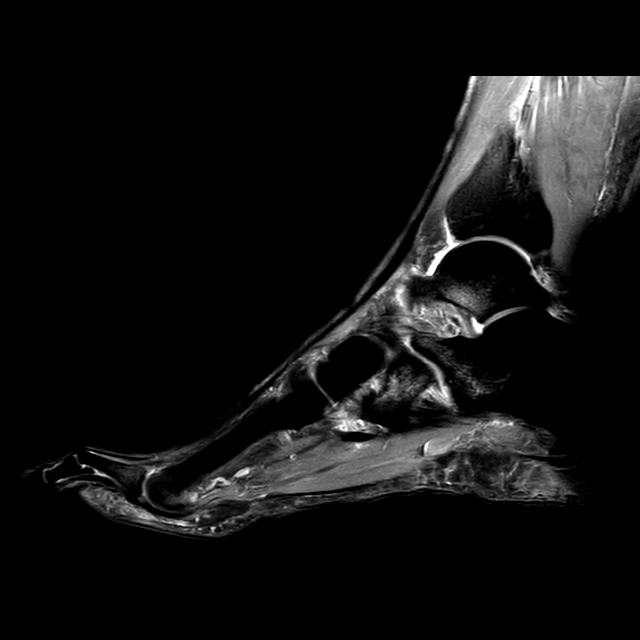
[im 19/31]
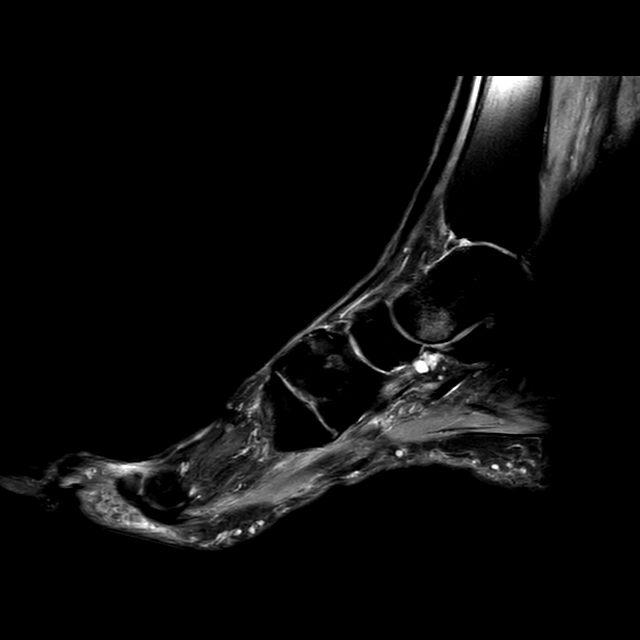
[im 25/31]
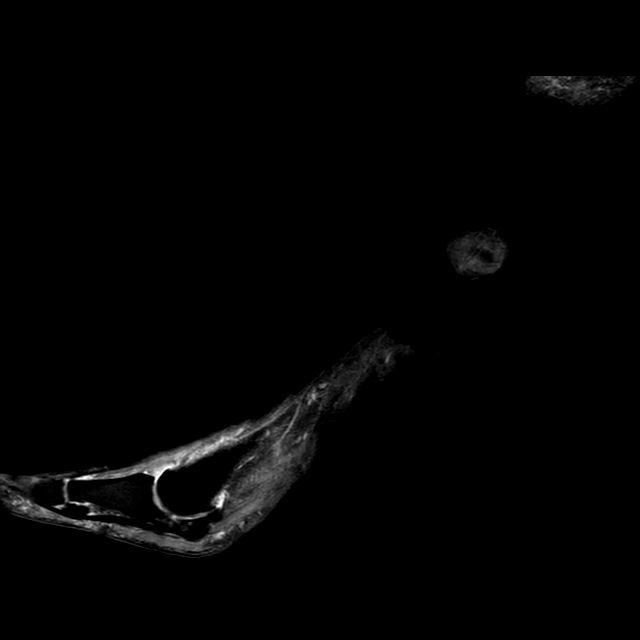
[im 31/31]
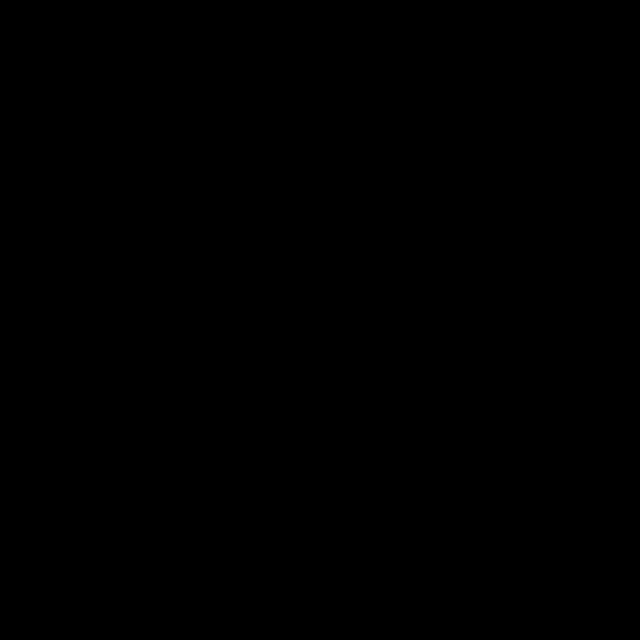

[Series 1201: T1 · coronal · 3.0mm · 0.26mm/px · 6 of 56 slices shown]
[im 1/56]
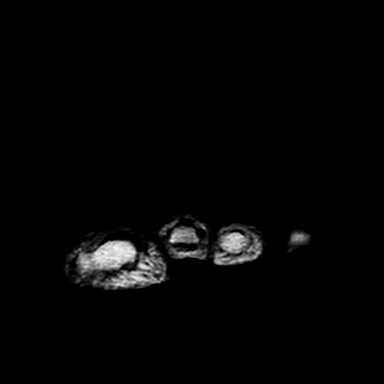
[im 7/56]
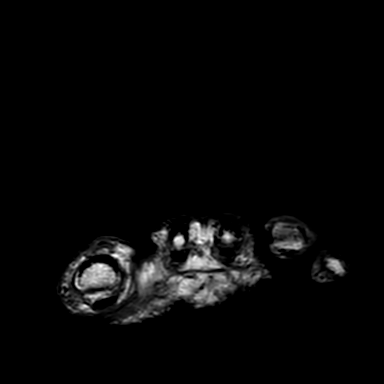
[im 19/56]
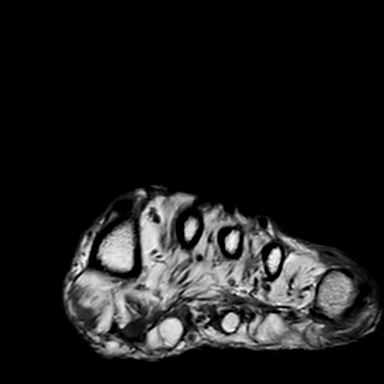
[im 25/56]
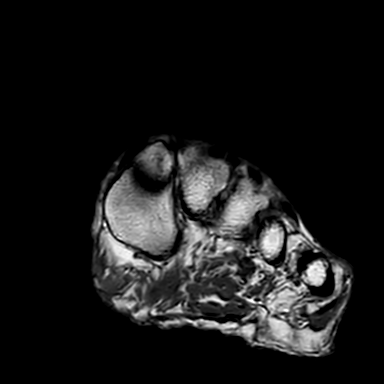
[im 31/56]
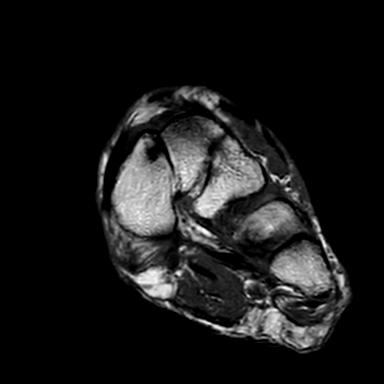
[im 49/56]
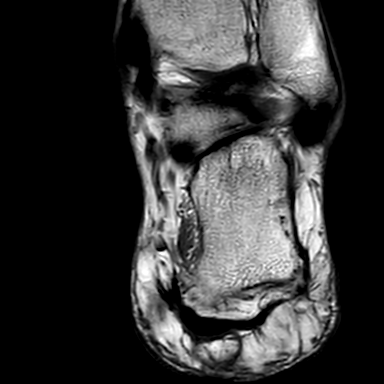

[17 of 40 positions shown; findings below may reference images not displayed]

FINDINGS: TENDONS: Mild posterior tibial tendon (PTT) thickening and minimal tendon sheath 
fluid, consistent with tendinosis. Small amount of peroneal tendon sheath fluid, 
consistent with tenosynovitis. The remaining flexor, extensor and peroneal 
tendons are intact. The Achilles tendon is preserved. 
LATERAL LIGAMENTS: The anterior talofibular ligament is intact. The 
calcaneofibular ligament and posterior talofibular ligament are preserved. 
SYNDESMOTIC LIGAMENTS: The anterior and posterior tibiofibular and interosseous 
ligaments are preserved. 
DELTOID LIGAMENTOUS COMPLEX: The deep and superficial components of the deltoid 
ligamentous complex are intact. 
SINUS TARSI LIGAMENTS: The cervical and interosseous ligaments are preserved. 
The inferior extensor retinaculum appears intact.  
MIDFOOT LIGAMENTS: Midfoot ligaments are intact. Specifically, the Lisfranc 
ligament is intact. 
BONES AND JOINTS: Subacute, incomplete transverse fracture through the lateral 
distal cuboid with extension into the fourth tarsometatarsal (TMT) joint. 0.6 cm 
focus of dorsal talar head flattening with subjacent bone marrow edema, 
consistent with subacute fracture. Contusions of the talar body, anterior 
process of the calcaneus and fifth metatarsal base. 0.3 cm stable osteochondral 
lesion in the anterior medial talar dome and with minimal cystic change and 
overlying cartilage fissuring. Small ankle joint effusion. Partial thickness 
chondromalacia of the subtalar joint. Mild degenerative change of the midfoot. 
Metatarsal-phalangeal (MTP) and interphalangeal (IP) joints are preserved. 
MUSCLES: Musculature is symmetric without mass, signal abnormality or atrophy.  
OTHER SOFT TISSUES: Tarsal tunnel is preserved. Sinus tarsi fat is preserved. 
Plantar fascia is intact. No ulceration. No abscess.
IMPRESSION: 1.  No ligament tear. 
2.  Subacute, incomplete transverse lateral distal cuboid intra-articular 
fracture.  
3.  Subacute 0.6 cm dorsal talar head impaction fracture.  
4.  Contusions of the talar body, anterior process of the calcaneus and 5th 
metatarsal base.  
5.  0.3 cm medial talar dome stable osteochondral lesion and small ankle joint 
effusion.  
6.  Mild degenerative change of the subtalar joint/midfoot.  
7.  Mild PTT tendinosis and peroneal tenosynovitis.
